# Patient Record
Sex: Female | Born: 1955 | Race: White | Hispanic: No | Marital: Single
Health system: Southern US, Community
[De-identification: ages and names within clinical notes are randomized; demographics above are authoritative.]

---

## 2002-07-10 ENCOUNTER — Emergency Department (HOSPITAL_COMMUNITY): Admission: EM | Admit: 2002-07-10 | Discharge: 2002-07-10 | Payer: Self-pay | Admitting: *Deleted

## 2002-08-30 ENCOUNTER — Other Ambulatory Visit: Admission: RE | Admit: 2002-08-30 | Discharge: 2002-08-30 | Payer: Self-pay | Admitting: Obstetrics and Gynecology

## 2004-05-17 ENCOUNTER — Ambulatory Visit (HOSPITAL_COMMUNITY): Admission: RE | Admit: 2004-05-17 | Discharge: 2004-05-17 | Payer: Self-pay | Admitting: Internal Medicine

## 2004-07-08 ENCOUNTER — Emergency Department (HOSPITAL_COMMUNITY): Admission: EM | Admit: 2004-07-08 | Discharge: 2004-07-08 | Payer: Self-pay | Admitting: Emergency Medicine

## 2005-06-17 ENCOUNTER — Ambulatory Visit (HOSPITAL_COMMUNITY): Admission: RE | Admit: 2005-06-17 | Discharge: 2005-06-17 | Payer: Self-pay | Admitting: Internal Medicine

## 2005-09-20 ENCOUNTER — Ambulatory Visit: Payer: Self-pay | Admitting: Gastroenterology

## 2006-08-19 ENCOUNTER — Inpatient Hospital Stay (HOSPITAL_COMMUNITY): Admission: EM | Admit: 2006-08-19 | Discharge: 2006-08-24 | Payer: Self-pay | Admitting: Emergency Medicine

## 2006-12-15 ENCOUNTER — Ambulatory Visit (HOSPITAL_COMMUNITY): Admission: RE | Admit: 2006-12-15 | Discharge: 2006-12-15 | Payer: Self-pay | Admitting: Internal Medicine

## 2007-01-18 ENCOUNTER — Ambulatory Visit (HOSPITAL_COMMUNITY): Admission: RE | Admit: 2007-01-18 | Discharge: 2007-01-18 | Payer: Self-pay | Admitting: Internal Medicine

## 2007-01-18 ENCOUNTER — Ambulatory Visit: Payer: Self-pay | Admitting: Internal Medicine

## 2007-08-17 ENCOUNTER — Ambulatory Visit (HOSPITAL_COMMUNITY): Payer: Self-pay | Admitting: Oncology

## 2007-08-17 ENCOUNTER — Encounter (HOSPITAL_COMMUNITY): Admission: RE | Admit: 2007-08-17 | Discharge: 2007-09-16 | Payer: Self-pay | Admitting: Oncology

## 2009-06-30 ENCOUNTER — Ambulatory Visit (HOSPITAL_COMMUNITY): Admission: RE | Admit: 2009-06-30 | Discharge: 2009-06-30 | Payer: Self-pay | Admitting: Family Medicine

## 2009-07-16 ENCOUNTER — Ambulatory Visit (HOSPITAL_COMMUNITY): Admission: RE | Admit: 2009-07-16 | Discharge: 2009-07-16 | Payer: Self-pay | Admitting: Family Medicine

## 2010-01-13 ENCOUNTER — Emergency Department (HOSPITAL_COMMUNITY): Admission: EM | Admit: 2010-01-13 | Discharge: 2010-01-13 | Payer: Self-pay | Admitting: Emergency Medicine

## 2010-01-15 ENCOUNTER — Observation Stay (HOSPITAL_COMMUNITY): Admission: EM | Admit: 2010-01-15 | Discharge: 2010-01-17 | Payer: Self-pay | Admitting: Emergency Medicine

## 2010-02-03 ENCOUNTER — Ambulatory Visit (HOSPITAL_COMMUNITY): Admission: RE | Admit: 2010-02-03 | Discharge: 2010-02-03 | Payer: Self-pay | Admitting: Family Medicine

## 2010-04-04 ENCOUNTER — Inpatient Hospital Stay (HOSPITAL_COMMUNITY): Admission: EM | Admit: 2010-04-04 | Discharge: 2010-04-08 | Payer: Self-pay | Admitting: Emergency Medicine

## 2010-05-22 ENCOUNTER — Emergency Department (HOSPITAL_COMMUNITY): Admission: EM | Admit: 2010-05-22 | Discharge: 2010-05-22 | Payer: Self-pay | Admitting: Emergency Medicine

## 2010-05-30 ENCOUNTER — Emergency Department (HOSPITAL_COMMUNITY): Admission: EM | Admit: 2010-05-30 | Discharge: 2010-05-30 | Payer: Self-pay | Admitting: Internal Medicine

## 2010-09-04 ENCOUNTER — Encounter: Payer: Self-pay | Admitting: Internal Medicine

## 2010-10-27 LAB — CBC
HCT: 33.3 % — ABNORMAL LOW (ref 36.0–46.0)
Hemoglobin: 11.4 g/dL — ABNORMAL LOW (ref 12.0–15.0)
MCH: 29.5 pg (ref 26.0–34.0)
MCHC: 34 g/dL (ref 30.0–36.0)
MCV: 86.6 fL (ref 78.0–100.0)
Platelets: 64 10*3/uL — ABNORMAL LOW (ref 150–400)
RBC: 3.85 MIL/uL — ABNORMAL LOW (ref 3.87–5.11)
RDW: 14.5 % (ref 11.5–15.5)
WBC: 3.1 10*3/uL — ABNORMAL LOW (ref 4.0–10.5)

## 2010-10-27 LAB — DIFFERENTIAL
Basophils Absolute: 0 10*3/uL (ref 0.0–0.1)
Basophils Relative: 1 % (ref 0–1)
Eosinophils Absolute: 0 10*3/uL (ref 0.0–0.7)
Eosinophils Relative: 1 % (ref 0–5)
Lymphocytes Relative: 48 % — ABNORMAL HIGH (ref 12–46)
Lymphs Abs: 1.5 10*3/uL (ref 0.7–4.0)
Monocytes Absolute: 0.4 10*3/uL (ref 0.1–1.0)
Monocytes Relative: 12 % (ref 3–12)
Neutro Abs: 1.2 10*3/uL — ABNORMAL LOW (ref 1.7–7.7)
Neutrophils Relative %: 39 % — ABNORMAL LOW (ref 43–77)

## 2010-10-27 LAB — BRAIN NATRIURETIC PEPTIDE: Pro B Natriuretic peptide (BNP): <30 pg/mL (ref 0.0–100.0)

## 2010-10-27 LAB — BASIC METABOLIC PANEL
BUN: 7 mg/dL (ref 6–23)
CO2: 26 mEq/L (ref 19–32)
Calcium: 9.1 mg/dL (ref 8.4–10.5)
Chloride: 105 mEq/L (ref 96–112)
Creatinine, Ser: 0.5 mg/dL (ref 0.4–1.2)
GFR calc Af Amer: >60 mL/min (ref >60)
GFR calc non Af Amer: >60 mL/min (ref >60)
Glucose, Bld: 85 mg/dL (ref 70–99)
Potassium: 3.6 mEq/L (ref 3.5–5.1)
Sodium: 135 mEq/L (ref 135–145)

## 2010-10-27 LAB — T-HELPER CELLS (CD4) COUNT (NOT AT ARMC)
CD4 % Helper T Cell: 47 % (ref 33–55)
CD4 T Cell Abs: 730 uL (ref 400–2700)

## 2010-10-28 LAB — BASIC METABOLIC PANEL
BUN: 8 mg/dL (ref 6–23)
CO2: 28 mEq/L (ref 19–32)
Calcium: 9.3 mg/dL (ref 8.4–10.5)
Chloride: 100 mEq/L (ref 96–112)
Creatinine, Ser: 0.62 mg/dL (ref 0.4–1.2)
GFR calc Af Amer: >60 mL/min (ref >60)
GFR calc non Af Amer: >60 mL/min (ref >60)
Glucose, Bld: 91 mg/dL (ref 70–99)
Potassium: 3.8 mEq/L (ref 3.5–5.1)
Sodium: 140 mEq/L (ref 135–145)

## 2010-10-28 LAB — URINALYSIS, ROUTINE W REFLEX MICROSCOPIC
Bilirubin Urine: NEGATIVE
Glucose, UA: NEGATIVE mg/dL
Hgb urine dipstick: NEGATIVE
Ketones, ur: NEGATIVE mg/dL
Nitrite: NEGATIVE
Protein, ur: NEGATIVE mg/dL
Specific Gravity, Urine: 1.01 (ref 1.005–1.030)
Urobilinogen, UA: 0.2 mg/dL (ref 0.0–1.0)
pH: 7 (ref 5.0–8.0)

## 2010-10-28 LAB — CBC
HCT: 38.7 % (ref 36.0–46.0)
Hemoglobin: 13 g/dL (ref 12.0–15.0)
MCH: 29.5 pg (ref 26.0–34.0)
MCHC: 33.6 g/dL (ref 30.0–36.0)
MCV: 87.8 fL (ref 78.0–100.0)
Platelets: 65 10*3/uL — ABNORMAL LOW (ref 150–400)
RBC: 4.41 MIL/uL (ref 3.87–5.11)
RDW: 14.5 % (ref 11.5–15.5)
WBC: 4 10*3/uL (ref 4.0–10.5)

## 2010-10-28 LAB — DIFFERENTIAL
Basophils Absolute: 0 10*3/uL (ref 0.0–0.1)
Basophils Relative: 0 % (ref 0–1)
Eosinophils Absolute: 0.1 10*3/uL (ref 0.0–0.7)
Eosinophils Relative: 2 % (ref 0–5)
Lymphocytes Relative: 46 % (ref 12–46)
Lymphs Abs: 1.8 10*3/uL (ref 0.7–4.0)
Monocytes Absolute: 0.4 10*3/uL (ref 0.1–1.0)
Monocytes Relative: 9 % (ref 3–12)
Neutro Abs: 1.7 10*3/uL (ref 1.7–7.7)
Neutrophils Relative %: 43 % (ref 43–77)

## 2010-10-29 LAB — HEPATIC FUNCTION PANEL
ALT: 46 U/L — ABNORMAL HIGH (ref 0–35)
Albumin: 3.5 g/dL (ref 3.5–5.2)
Alkaline Phosphatase: 77 U/L (ref 39–117)
Total Protein: 6.8 g/dL (ref 6.0–8.3)

## 2010-10-29 LAB — BASIC METABOLIC PANEL
BUN: 6 mg/dL (ref 6–23)
BUN: 6 mg/dL (ref 6–23)
BUN: 7 mg/dL (ref 6–23)
CO2: 27 mEq/L (ref 19–32)
CO2: 28 mEq/L (ref 19–32)
CO2: 29 mEq/L (ref 19–32)
Calcium: 8.8 mg/dL (ref 8.4–10.5)
Calcium: 9.2 mg/dL (ref 8.4–10.5)
Chloride: 104 mEq/L (ref 96–112)
Chloride: 106 mEq/L (ref 96–112)
Chloride: 110 mEq/L (ref 96–112)
Creatinine, Ser: 0.49 mg/dL (ref 0.4–1.2)
Creatinine, Ser: 0.59 mg/dL (ref 0.4–1.2)
Creatinine, Ser: 0.59 mg/dL (ref 0.4–1.2)
GFR calc Af Amer: >60 mL/min (ref >60)
GFR calc Af Amer: >60 mL/min (ref >60)
GFR calc non Af Amer: >60 mL/min (ref >60)
Glucose, Bld: 84 mg/dL (ref 70–99)
Glucose, Bld: 99 mg/dL (ref 70–99)
Potassium: 3.3 mEq/L — ABNORMAL LOW (ref 3.5–5.1)
Potassium: 4.3 mEq/L (ref 3.5–5.1)
Sodium: 135 mEq/L (ref 135–145)

## 2010-10-29 LAB — DIFFERENTIAL
Basophils Absolute: 0 10*3/uL (ref 0.0–0.1)
Basophils Relative: 0 % (ref 0–1)
Eosinophils Absolute: 0 10*3/uL (ref 0.0–0.7)
Eosinophils Absolute: 0 10*3/uL (ref 0.0–0.7)
Eosinophils Relative: 0 % (ref 0–5)
Eosinophils Relative: 1 % (ref 0–5)
Lymphocytes Relative: 25 % (ref 12–46)
Lymphocytes Relative: 48 % — ABNORMAL HIGH (ref 12–46)
Lymphs Abs: 1.3 10*3/uL (ref 0.7–4.0)
Lymphs Abs: 1.5 10*3/uL (ref 0.7–4.0)
Monocytes Absolute: 0.4 10*3/uL (ref 0.1–1.0)
Monocytes Absolute: 0.6 10*3/uL (ref 0.1–1.0)
Monocytes Relative: 10 % (ref 3–12)
Monocytes Relative: 13 % — ABNORMAL HIGH (ref 3–12)
Neutro Abs: 4 10*3/uL (ref 1.7–7.7)
Neutrophils Relative %: 65 % (ref 43–77)

## 2010-10-29 LAB — VANCOMYCIN, TROUGH: Vancomycin Tr: 10.3 ug/mL (ref 10.0–20.0)

## 2010-10-29 LAB — PROTIME-INR
INR: 1.16 (ref 0.00–1.49)
Prothrombin Time: 15 seconds (ref 11.6–15.2)

## 2010-10-29 LAB — CBC
HCT: 30.4 % — ABNORMAL LOW (ref 36.0–46.0)
HCT: 31.6 % — ABNORMAL LOW (ref 36.0–46.0)
Hemoglobin: 10.7 g/dL — ABNORMAL LOW (ref 12.0–15.0)
MCH: 29.2 pg (ref 26.0–34.0)
MCH: 29.3 pg (ref 26.0–34.0)
MCHC: 33.7 g/dL (ref 30.0–36.0)
MCV: 87 fL (ref 78.0–100.0)
MCV: 87.8 fL (ref 78.0–100.0)
Platelets: 61 10*3/uL — ABNORMAL LOW (ref 150–400)
Platelets: 62 10*3/uL — ABNORMAL LOW (ref 150–400)
RBC: 3.46 MIL/uL — ABNORMAL LOW (ref 3.87–5.11)
RBC: 3.63 MIL/uL — ABNORMAL LOW (ref 3.87–5.11)
RDW: 14.2 % (ref 11.5–15.5)
WBC: 6.1 10*3/uL (ref 4.0–10.5)

## 2010-10-29 LAB — IRON AND TIBC
Saturation Ratios: 9 % — ABNORMAL LOW (ref 20–55)
TIBC: 398 ug/dL (ref 250–470)
UIBC: 364 ug/dL

## 2010-10-29 LAB — RETICULOCYTES: Retic Ct Pct: 1.9 % (ref 0.4–3.1)

## 2010-10-29 LAB — APTT: aPTT: 29 seconds (ref 24–37)

## 2010-10-29 LAB — FERRITIN: Ferritin: 28 ng/mL (ref 10–291)

## 2010-10-29 LAB — FOLATE RBC: RBC Folate: 767 ng/mL — ABNORMAL HIGH (ref 180–600)

## 2010-11-01 LAB — BASIC METABOLIC PANEL
BUN: 4 mg/dL — ABNORMAL LOW (ref 6–23)
BUN: 5 mg/dL — ABNORMAL LOW (ref 6–23)
BUN: 8 mg/dL (ref 6–23)
CO2: 27 mEq/L (ref 19–32)
CO2: 27 mEq/L (ref 19–32)
Calcium: 8.7 mg/dL (ref 8.4–10.5)
Calcium: 8.9 mg/dL (ref 8.4–10.5)
Calcium: 9.4 mg/dL (ref 8.4–10.5)
GFR calc non Af Amer: >60 mL/min (ref >60)
GFR calc non Af Amer: >60 mL/min (ref >60)
Glucose, Bld: 88 mg/dL (ref 70–99)
Glucose, Bld: 88 mg/dL (ref 70–99)
Glucose, Bld: 97 mg/dL (ref 70–99)
Sodium: 139 mEq/L (ref 135–145)
Sodium: 141 mEq/L (ref 135–145)

## 2010-11-01 LAB — CBC
Hemoglobin: 11.1 g/dL — ABNORMAL LOW (ref 12.0–15.0)
Hemoglobin: 12.1 g/dL (ref 12.0–15.0)
MCHC: 33.8 g/dL (ref 30.0–36.0)
MCHC: 34.2 g/dL (ref 30.0–36.0)
Platelets: 57 10*3/uL — ABNORMAL LOW (ref 150–400)
Platelets: 70 10*3/uL — ABNORMAL LOW (ref 150–400)
RDW: 14.8 % (ref 11.5–15.5)
RDW: 14.9 % (ref 11.5–15.5)
RDW: 15 % (ref 11.5–15.5)
WBC: 4.1 10*3/uL (ref 4.0–10.5)

## 2010-11-01 LAB — DIFFERENTIAL
Basophils Absolute: 0 10*3/uL (ref 0.0–0.1)
Basophils Absolute: 0 10*3/uL (ref 0.0–0.1)
Basophils Relative: 0 % (ref 0–1)
Basophils Relative: 1 % (ref 0–1)
Blasts: 0 %
Eosinophils Relative: 0 % (ref 0–5)
Eosinophils Relative: 2 % (ref 0–5)
Lymphocytes Relative: 38 % (ref 12–46)
Lymphocytes Relative: 7 % — ABNORMAL LOW (ref 12–46)
Lymphs Abs: 1.6 10*3/uL (ref 0.7–4.0)
Metamyelocytes Relative: 0 %
Monocytes Absolute: 0.3 10*3/uL (ref 0.1–1.0)
Monocytes Absolute: 0.3 10*3/uL (ref 0.1–1.0)
Monocytes Absolute: 0.4 10*3/uL (ref 0.1–1.0)
Monocytes Relative: 8 % (ref 3–12)
Neutro Abs: 1 10*3/uL — ABNORMAL LOW (ref 1.7–7.7)
Neutro Abs: 2.2 10*3/uL (ref 1.7–7.7)
Neutro Abs: 6 10*3/uL (ref 1.7–7.7)
Neutrophils Relative %: 54 % (ref 43–77)
nRBC: 0 /100 WBC

## 2010-11-01 LAB — URINALYSIS, ROUTINE W REFLEX MICROSCOPIC
Bilirubin Urine: NEGATIVE
Nitrite: NEGATIVE
Specific Gravity, Urine: 1.01 (ref 1.005–1.030)
Urobilinogen, UA: 1 mg/dL (ref 0.0–1.0)

## 2010-12-23 ENCOUNTER — Ambulatory Visit (INDEPENDENT_AMBULATORY_CARE_PROVIDER_SITE_OTHER): Payer: Medicaid Other | Admitting: Internal Medicine

## 2010-12-23 DIAGNOSIS — B182 Chronic viral hepatitis C: Secondary | ICD-10-CM

## 2010-12-28 NOTE — Op Note (Signed)
Meagan Yates, Meagan Yates               ACCOUNT NO.:  [id number]   MEDICAL RECORD NO.:  [id number]          PATIENT TYPE:  AMB   LOCATION:  DAY                           FACILITY:  [facility]   PHYSICIAN:  R. Roetta Sessions, M.D. DATE OF BIRTH:  [birth year]   DATE OF PROCEDURE:  01/18/2007  DATE OF DISCHARGE:                               OPERATIVE REPORT   PROCEDURE:  Screening colonoscopy.   INDICATIONS FOR PROCEDURE:  The patient is a 55 year old lady with no  lower GI tract symptoms sent over courtesy Dr. Felecia Shelling for colorectal  cancer screening.  She has never had her lower tract imaged previously.  She has no lower tract symptoms.  There is no family history colorectal  neoplasia. Colonoscopy is now being done.  This approach has discussed  with the patient at length.  Potential risks, benefits and alternatives  have been reviewed, questions answered.  Please see documentation in the  medical record.   PROCEDURE NOTE:  O2 saturation, blood pressure, pulse and respirations  monitored throughout entire procedure.  Conscious sedation IV Versed,  Demerol incremental doses.   INSTRUMENTATION:  Pentax video chip system.   FINDINGS:  Digital rectal exam revealed no abnormalities.   ENDOSCOPIC FINDINGS:  The prep was good.  Examination colonic mucosa was  undertaken.  Scope was advanced through the transverse, right colon to  the area of appendiceal orifice, ileocecal valve, cecum.  These  structures well seen, photographed for the record.  From this level  scope was slowly withdrawn.  All previous mentioned mucosal surfaces  were again seen.  The colonic mucosa appeared normal.  Scope was pulled  down the rectum.  Thorough examination of rectal mucosa including right  retroflex view of the anal verge demonstrated no abnormalities.  The  patient tolerated the procedure well as reacted endoscopy.   IMPRESSION:  1. Normal rectum.  2. Normal colon.   RECOMMENDATIONS:  Consider repeat screening  colonoscopy 10 years.      Jonathon Bellows, M.D.  Electronically Signed     RMR/MEDQ  D:  01/18/2007  T:  01/18/2007  Job:  [id number]   cc:   Tesfaye D. Felecia Shelling, MD  Fax: [phone]

## 2010-12-31 NOTE — Discharge Summary (Signed)
Meagan Yates, Meagan Yates               ACCOUNT NO.:  [id number]   MEDICAL RECORD NO.:  [id number]          PATIENT TYPE:  INP   LOCATION:  A331                          FACILITY:  [facility]   PHYSICIAN:  Tesfaye D. Felecia Shelling, MD   DATE OF BIRTH:  [birth year]   DATE OF ADMISSION:  08/19/2006  DATE OF DISCHARGE:  01/10/2008LH                               DISCHARGE SUMMARY   DISCHARGE DIAGNOSES:  1. Cellulitis of the left leg.  2. Varicose veins.  3. Mental retardation.  4. Chronic hepatitis C.   DISCHARGE MEDICATIONS:  1. Aspirin 81 mg p.o. daily.  2. Doxycycline 100 mg p.o. b.i.d. for 7 days.  3. Calcium 600 mg daily.  4. Tylenol 1,000 mg p.o. q.6h. p.r.n.   HOSPITAL COURSE:  This is a 55 year old female patient with history of  mental retardation and chronic hepatitis C, who was brought to the  emergency room with diffuse redness and pain of the left leg.  She was  admitted as a case of cellulitis of the left leg.  The patient received  IV antibiotics.  Over the course of hospitalization the patient improved  and she was discharged back to the rest home to continue her required  treatment.      Tesfaye D. Felecia Shelling, MD  Electronically Signed     TDF/MEDQ  D:  09/21/2006  T:  09/21/2006  Job:  [id number]

## 2010-12-31 NOTE — H&P (Signed)
NAMEANYAE, GRIFFITH               ACCOUNT NO.:  [id number]   MEDICAL RECORD NO.:  [id number]          PATIENT TYPE:  INP   LOCATION:  A331                          FACILITY:  [facility]   PHYSICIAN:  Tesfaye D. Felecia Shelling, MD   DATE OF BIRTH:  Aug 12, 1956   DATE OF ADMISSION:  08/19/2006  DATE OF DISCHARGE:  LH                              HISTORY & PHYSICAL   CHIEF COMPLAINT:  Redness and swelling of the left lower extremity.   HISTORY OF PRESENT ILLNESS:  This is a 55 year old female patient with a  history of mental retardation, hepatitis C and varicose veins who was  brought to the emergency room with the above complaint.  The patient was  in her usual state of health until this morning when she noticed redness  on her left leg with pain and swelling.  She also has areas on her inner  part of her thigh which has also similar symptoms.  The patient was  brought to the emergency room and she was evaluated and admitted for  further evaluation.  The patient has a low grade fever, but no chills or  headache.  She has no history of trauma or external ulceration.   REVIEW OF SYSTEMS:  No headache, cough, shortness of breath,  palpitations, chest pain, nausea, vomiting, abdominal pain, dysuria,  urgency or frequency of urination.   PAST MEDICAL HISTORY:  1. Mental retardation.  2. Hepatitis C.  3. Varicose veins.   CURRENT MEDICATIONS:  1. Aspirin 81 mg daily.  2. Calcium 600 mg daily.  3. Tylenol 1000 mg p.o. q.6h. as needed.   SOCIAL HISTORY:  The patient is currently a resident of a nursing home.  She has a history of mental retardation.  She is currently disabled due  to her illness.  The patient has a history of tobacco smoking in the  past.  No alcohol or substance abuse.   PHYSICAL EXAMINATION:  GENERAL:  The patient is alert, awake and  resting.  VITALS:  Blood pressure 111/65, pulse 99, respiratory rate 16,  temperature 99.5 degrees Fahrenheit.  HEENT:  Pupils are equal and  reactive.  NECK:  Supple.  CHEST:  Clear lung fields with good air entry.  CARDIOVASCULAR:  First and second heart sound heard.  No murmur, no  gallop.  ABDOMEN:  Soft and lax.  Bowel sounds are positive.  No mass, no  organomegaly.  EXTREMITIES:  The patient has diffuse __________ of the left leg which  is tender and __________.  The patient also has a streak of redness  around the inner part of her thigh.  She has also diffuse varicose veins  on both lower extremities.   LABORATORIES ON ADMISSION:  CBC with WBC of 9.1, hemoglobin 13.2,  hematocrit 38.9, platelets 74.  Sodium 130, potassium 3.5, chloride 100,  carbon dioxide 13, glucose 104, BUN 12, creatinine 0.9, alkaline  phosphatase 28, AST 56, ALT 38, total protein 7.8, albumin 3.9, calcium  9.4.   ASSESSMENT:  1. This is a 55 year old female patient who came with diffuse redness  and pain and swelling of the left leg.  The patient also has a      small area of redness on her inner part of the thigh along her      vascular line.  The etiology of this lesion is not clear to me at      this time.  However, the possibility of cellulitis,      thrombophlebitis and vasculitis will be considered.  2. Mental retardation.  3. History of chronic hepatitis C.  4. Varicose veins.   PLAN:  Will empirically cover the patient on IV antibiotics.  Blood  culture has been done in the emergency room.  I will continue to monitor  her CBC and BMP.  I will do surgical consult for a skin biopsy.      Tesfaye D. Felecia Shelling, MD  Electronically Signed     TDF/MEDQ  D:  08/19/2006  T:  08/19/2006  Job:  [id number]

## 2011-05-05 LAB — FOLATE: Folate: >20

## 2011-05-05 LAB — IRON AND TIBC: Iron: 51

## 2011-06-16 ENCOUNTER — Encounter (INDEPENDENT_AMBULATORY_CARE_PROVIDER_SITE_OTHER): Payer: Self-pay | Admitting: Internal Medicine

## 2011-06-23 ENCOUNTER — Ambulatory Visit (INDEPENDENT_AMBULATORY_CARE_PROVIDER_SITE_OTHER): Payer: Medicaid Other | Admitting: Internal Medicine

## 2012-06-02 IMAGING — CR DG CHEST 1V PORT
1 series · 1 of 1 positions shown · non-contrast
Comparison: Chest radiograph performed 05/22/2010

CLINICAL DATA: Bilateral leg swelling.

PORTABLE CHEST - 1 VIEW

[view not recorded]
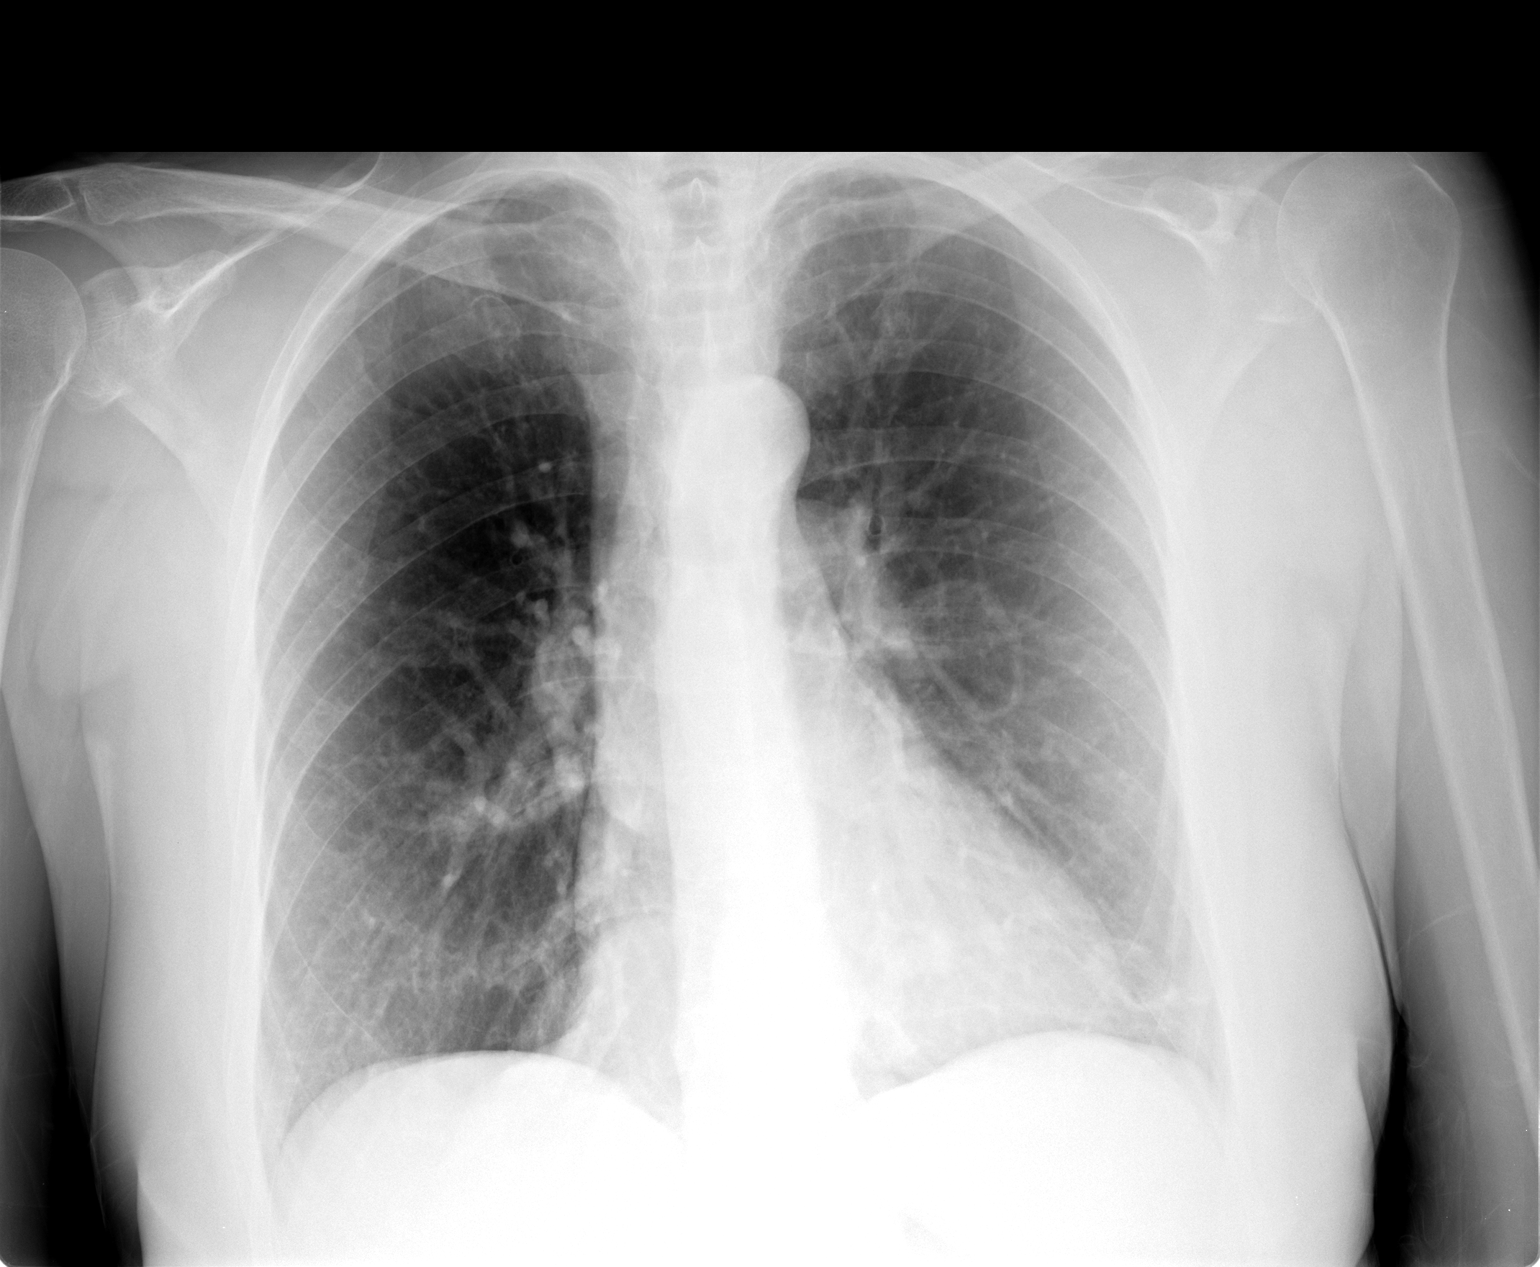

[1 of 1 positions shown; findings below may reference images not displayed]

FINDINGS: The lungs are well-aerated.  Vascular congestion is
noted.  Mild left basilar atelectasis is seen.  There is no
evidence of pleural effusion or pneumothorax.  No significant
pulmonary edema is appreciated.

The cardiomediastinal silhouette is within normal limits.  No acute
osseous abnormalities are seen.
IMPRESSION: Vascular congestion and mild left basilar atelectasis; no evidence
of pulmonary edema.

## 2016-12-16 ENCOUNTER — Telehealth: Payer: Self-pay | Admitting: Internal Medicine

## 2016-12-16 NOTE — Telephone Encounter (Signed)
Dr. Jena Gaussourk did last colonoscopy 01/18/2007 and next due in 10 years. Letter mailed to pt.

## 2016-12-16 NOTE — Telephone Encounter (Signed)
Recall for tcs, may be rehman patient

## 2018-05-15 DEATH — deceased
